# Patient Record
Sex: Female | Born: 1970 | Race: Black or African American | Hispanic: No | Marital: Single | Smoking: Never smoker
Health system: Southern US, Community
[De-identification: ages and names within clinical notes are randomized; demographics above are authoritative.]

## PROBLEM LIST (undated history)

## (undated) DIAGNOSIS — E785 Hyperlipidemia, unspecified: Secondary | ICD-10-CM

## (undated) DIAGNOSIS — R7303 Prediabetes: Secondary | ICD-10-CM

## (undated) DIAGNOSIS — I10 Essential (primary) hypertension: Secondary | ICD-10-CM

---

## 2012-10-07 ENCOUNTER — Ambulatory Visit: Payer: Self-pay | Admitting: Neurology

## 2012-10-07 ENCOUNTER — Inpatient Hospital Stay: Payer: Self-pay | Admitting: Internal Medicine

## 2012-10-07 LAB — URINALYSIS, COMPLETE
Bacteria: NONE SEEN
Bilirubin,UR: NEGATIVE
Glucose,UR: NEGATIVE mg/dL (ref 0–75)
Granular Cast: 1
Hyaline Cast: 12
Ketone: NEGATIVE
Ph: 5 (ref 4.5–8.0)
Protein: 30
RBC,UR: 17 /HPF (ref 0–5)
Specific Gravity: 1.026 (ref 1.003–1.030)

## 2012-10-07 LAB — COMPREHENSIVE METABOLIC PANEL
Anion Gap: 9 (ref 7–16)
BUN: 9 mg/dL (ref 7–18)
Chloride: 105 mmol/L (ref 98–107)
Co2: 24 mmol/L (ref 21–32)
Creatinine: 1.14 mg/dL (ref 0.60–1.30)
EGFR (African American): >60
Glucose: 119 mg/dL — ABNORMAL HIGH (ref 65–99)
Potassium: 3.7 mmol/L (ref 3.5–5.1)
SGOT(AST): 21 U/L (ref 15–37)
SGPT (ALT): 30 U/L (ref 12–78)
Sodium: 138 mmol/L (ref 136–145)

## 2012-10-07 LAB — CBC
HCT: 38.5 % (ref 35.0–47.0)
MCH: 28.6 pg (ref 26.0–34.0)
MCHC: 34.1 g/dL (ref 32.0–36.0)
MCV: 84 fL (ref 80–100)
RBC: 4.58 10*6/uL (ref 3.80–5.20)
RDW: 13.9 % (ref 11.5–14.5)
WBC: 14.5 10*3/uL — ABNORMAL HIGH (ref 3.6–11.0)

## 2012-10-07 LAB — DRUG SCREEN, URINE
Amphetamines, Ur Screen: NEGATIVE (ref ?–1000)
Benzodiazepine, Ur Scrn: NEGATIVE (ref ?–200)
Cannabinoid 50 Ng, Ur ~~LOC~~: NEGATIVE (ref ?–50)
Cocaine Metabolite,Ur ~~LOC~~: NEGATIVE (ref ?–300)
MDMA (Ecstasy)Ur Screen: NEGATIVE (ref ?–500)
Methadone, Ur Screen: NEGATIVE (ref ?–300)
Opiate, Ur Screen: NEGATIVE (ref ?–300)

## 2012-10-07 LAB — TROPONIN I
Troponin-I: <0.02 ng/mL
Troponin-I: <0.02 ng/mL
Troponin-I: <0.02 ng/mL

## 2012-10-07 LAB — CK TOTAL AND CKMB (NOT AT ARMC)
CK-MB: <0.5 ng/mL — ABNORMAL LOW (ref 0.5–3.6)
CK-MB: <0.5 ng/mL — ABNORMAL LOW (ref 0.5–3.6)

## 2012-10-07 LAB — SEDIMENTATION RATE: Erythrocyte Sed Rate: 48 mm/hr — ABNORMAL HIGH (ref 0–20)

## 2012-10-08 LAB — BASIC METABOLIC PANEL
Anion Gap: 6 — ABNORMAL LOW (ref 7–16)
BUN: 10 mg/dL (ref 7–18)
Calcium, Total: 8.4 mg/dL — ABNORMAL LOW (ref 8.5–10.1)
Chloride: 109 mmol/L — ABNORMAL HIGH (ref 98–107)
Creatinine: 1.06 mg/dL (ref 0.60–1.30)
EGFR (African American): >60
EGFR (Non-African Amer.): >60
Glucose: 106 mg/dL — ABNORMAL HIGH (ref 65–99)
Osmolality: 279 (ref 275–301)
Potassium: 3.9 mmol/L (ref 3.5–5.1)
Sodium: 140 mmol/L (ref 136–145)

## 2012-10-08 LAB — CK TOTAL AND CKMB (NOT AT ARMC): CK, Total: 118 U/L (ref 21–215)

## 2012-10-08 LAB — TSH: Thyroid Stimulating Horm: 3.2 u[IU]/mL

## 2012-10-08 LAB — LIPID PANEL
HDL Cholesterol: 36 mg/dL — ABNORMAL LOW (ref 40–60)
Ldl Cholesterol, Calc: 50 mg/dL (ref 0–100)
VLDL Cholesterol, Calc: 14 mg/dL (ref 5–40)

## 2012-10-08 LAB — CBC WITH DIFFERENTIAL/PLATELET
Basophil #: 0 10*3/uL (ref 0.0–0.1)
Basophil %: 0.5 %
Eosinophil #: 0.1 10*3/uL (ref 0.0–0.7)
Lymphocyte %: 30.7 %
MCH: 28.8 pg (ref 26.0–34.0)
MCHC: 34.4 g/dL (ref 32.0–36.0)
MCV: 84 fL (ref 80–100)
Monocyte #: 0.8 x10 3/mm (ref 0.2–0.9)
Neutrophil #: 6.5 10*3/uL (ref 1.4–6.5)
Platelet: 356 10*3/uL (ref 150–440)

## 2012-10-08 LAB — TROPONIN I: Troponin-I: <0.02 ng/mL

## 2012-10-11 ENCOUNTER — Encounter: Payer: Self-pay | Admitting: Family Medicine

## 2012-10-11 DIAGNOSIS — I639 Cerebral infarction, unspecified: Secondary | ICD-10-CM

## 2012-10-11 DIAGNOSIS — E039 Hypothyroidism, unspecified: Secondary | ICD-10-CM

## 2012-10-11 HISTORY — DX: Hypothyroidism, unspecified: E03.9

## 2012-10-11 HISTORY — DX: Cerebral infarction, unspecified: I63.9

## 2012-11-09 ENCOUNTER — Encounter: Payer: Self-pay | Admitting: Family Medicine

## 2013-11-08 IMAGING — CT CT HEAD WITHOUT CONTRAST
2 of 3 series · 17 of 30 positions shown, 20 images · non-contrast
Comparison: none

REASON FOR EXAM: weakness, slurred speech
COMMENTS:

PROCEDURE:     CT  - CT HEAD WITHOUT CONTRAST  - October 07, 2012  [DATE]
RESULT:     Comparison:  None
TECHNIQUE: Multiple axial images from the foramen magnum to the vertex were
obtained without IV contrast.

[Series 2: soft tissue · axial · 0.42mm/px · z∈[-618,-478]mm · 11 of 34 slices shown, 14 images]
[im 3/34  brain]
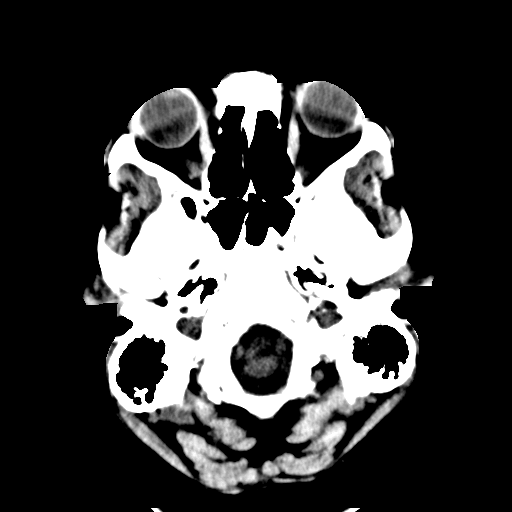
[im 3/34  bone]
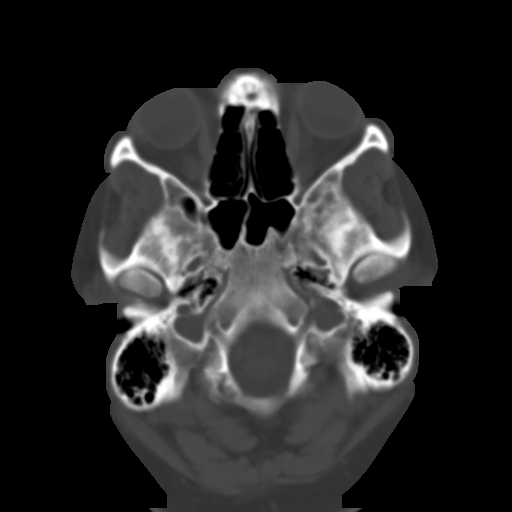
[im 6/34  brain]
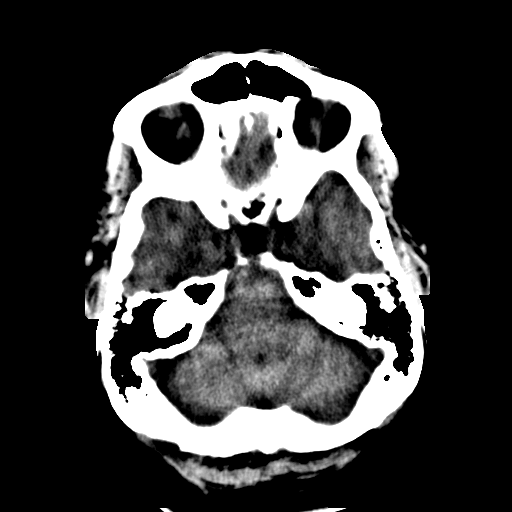
[im 9/34  brain]
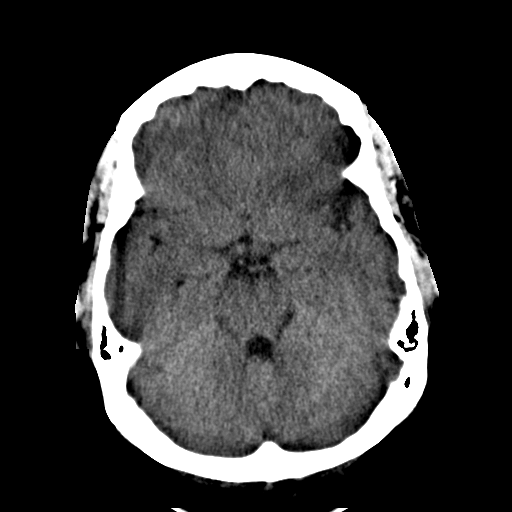
[im 12/34  brain]
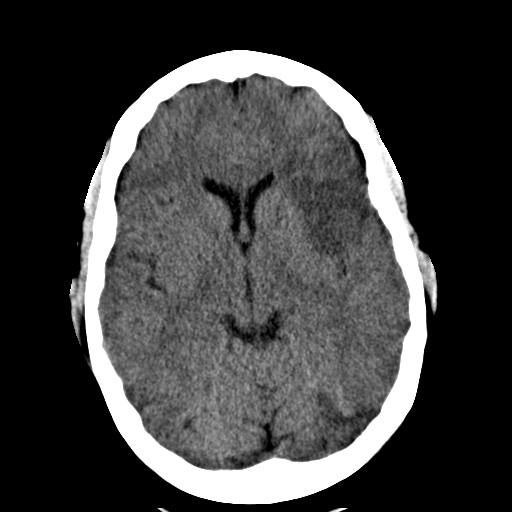
[im 14/34  brain]
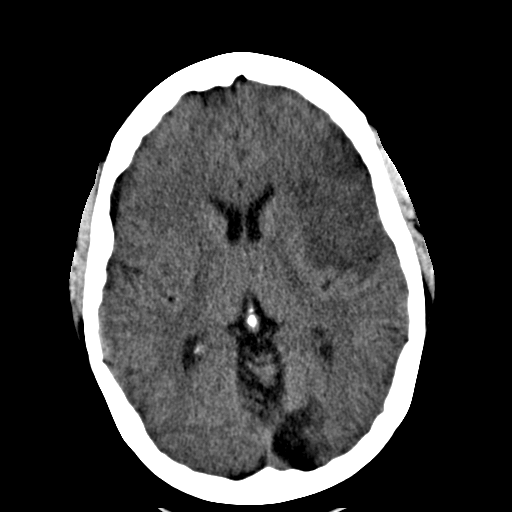
[im 14/34  bone]
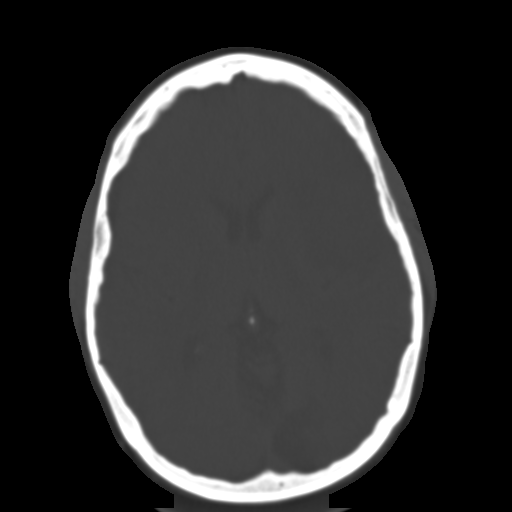
[im 17/34  brain]
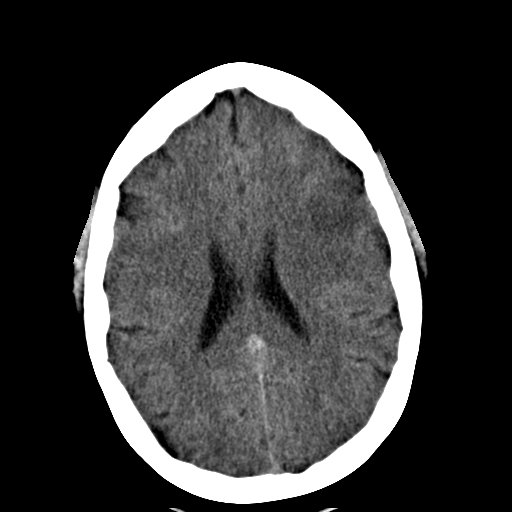
[im 20/34  brain]
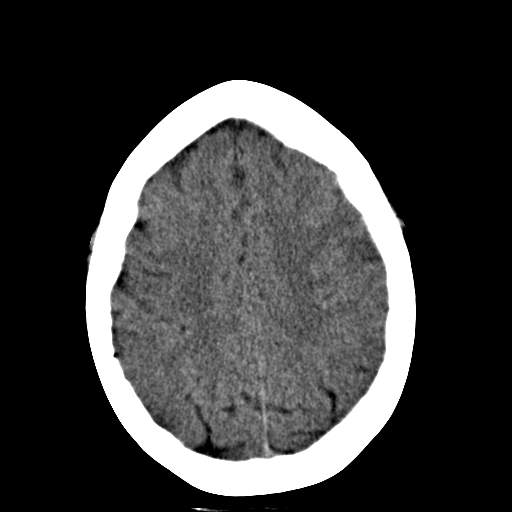
[im 23/34  brain]
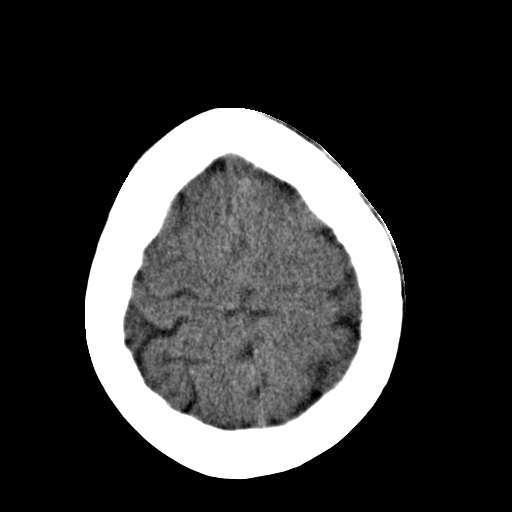
[im 25/34  brain]
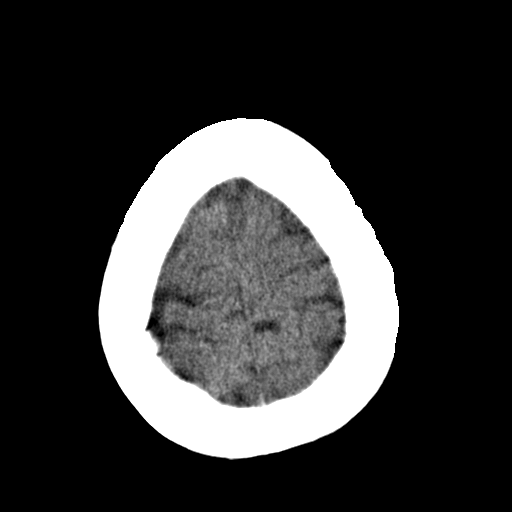
[im 25/34  bone]
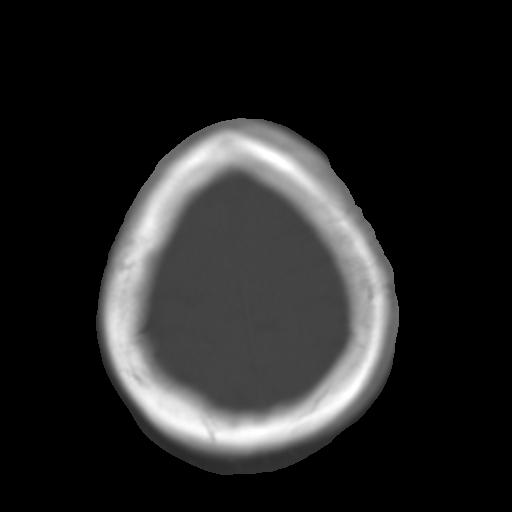
[im 28/34  brain]
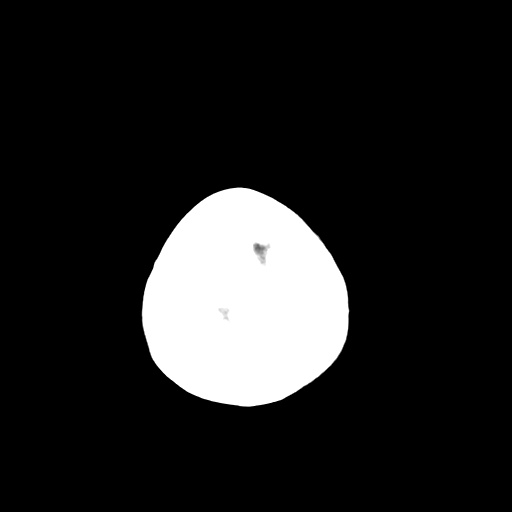
[im 31/34  brain]
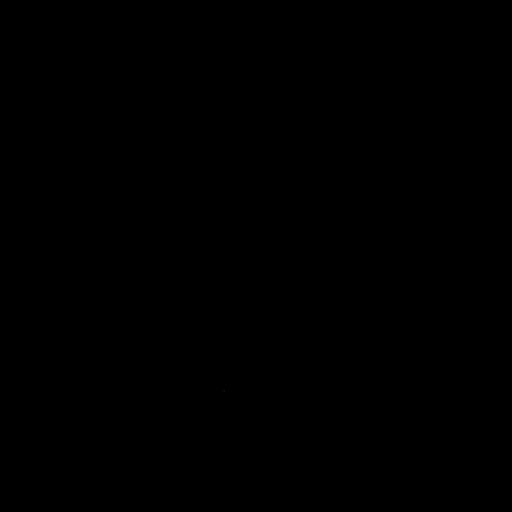

[Series 3: bone · axial · 0.42mm/px · z∈[-618,-528]mm · 6 of 33 slices shown]
[im 3/33  bone]
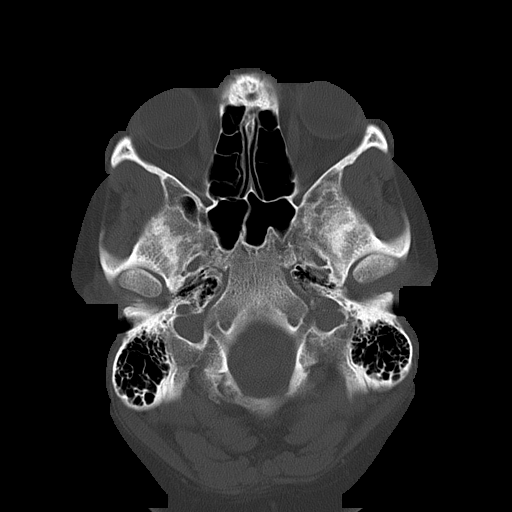
[im 6/33  bone]
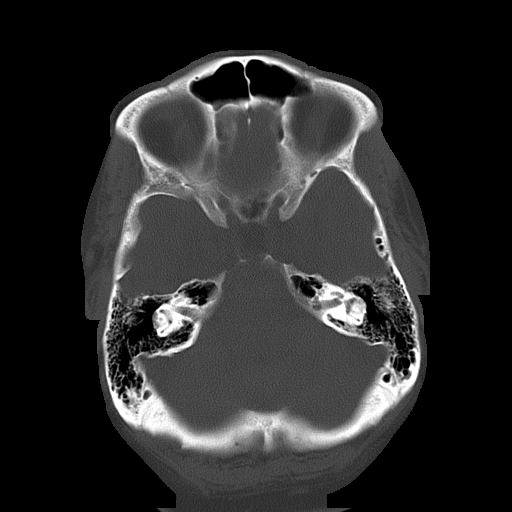
[im 12/33  bone]
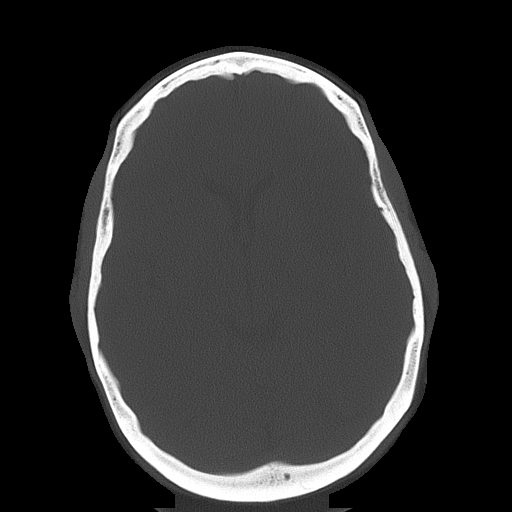
[im 15/33  bone]
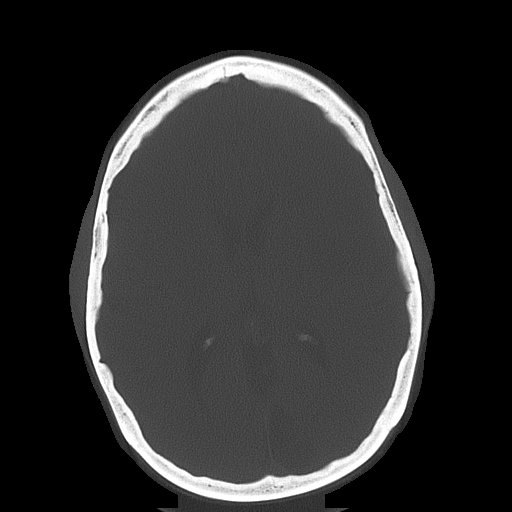
[im 18/33  bone]
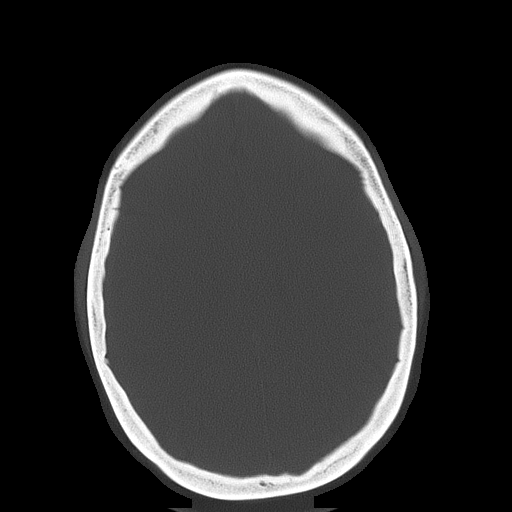
[im 21/33  bone]
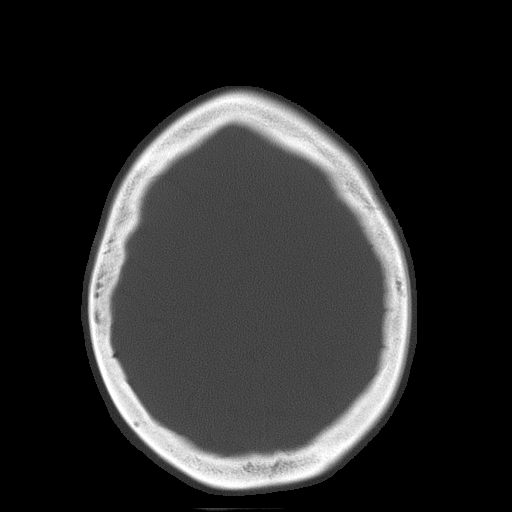

[17 of 30 positions shown; findings below may reference images not displayed]

FINDINGS: There is a focal area of loss of the gray-white junction and effacement of
sulci involving the left frontal lobe and insula. The findings are
concerning for acute infarct. No extra-axial fluid collection. No
intracranial hemorrhage. No midline shift.

The osseous structures are unremarkable.
IMPRESSION: Findings concerning for moderate sized acute infarct in the left frontal
lobe. Further evaluation could be provided with MRI.

## 2013-11-09 IMAGING — CT CT HEAD WITHOUT CONTRAST
1 series · 15 of 30 positions shown, 19 images · non-contrast
Comparison: none

REASON FOR EXAM: Hemarrhagic conversion of CVA. RUE weakness.
COMMENTS:

PROCEDURE:     CT  - CT HEAD WITHOUT CONTRAST  - October 08, 2012 [DATE]
RESULT:     Head CT dated the 8515 comparison made to prior study dated
10/08/2012.
TECHNIQUE: Helical noncontrasted 5 mm sections were obtained from the skull
base to the vertex.

[Series 2: soft tissue · axial · 0.43mm/px · z∈[-90,+45]mm · 15 of 30 slices shown, 19 images]
[im 2/30  brain]
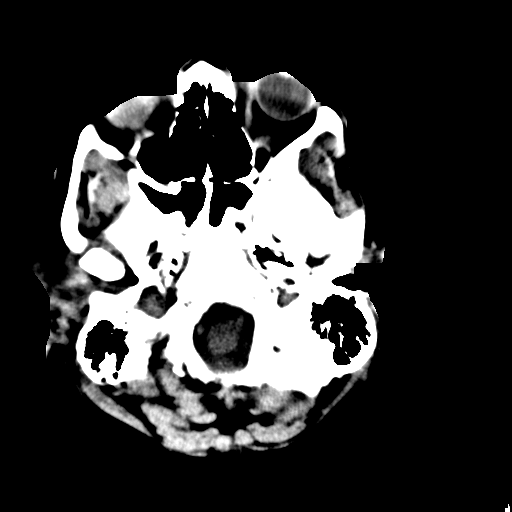
[im 2/30  bone]
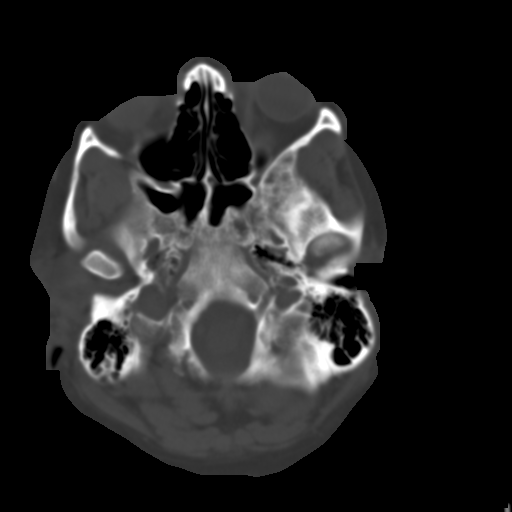
[im 4/30  brain]
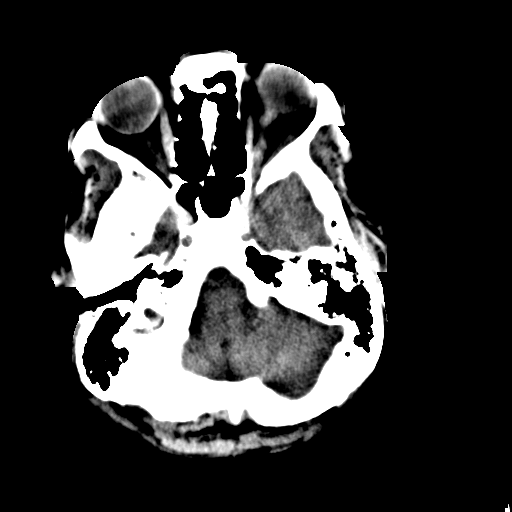
[im 6/30  brain]
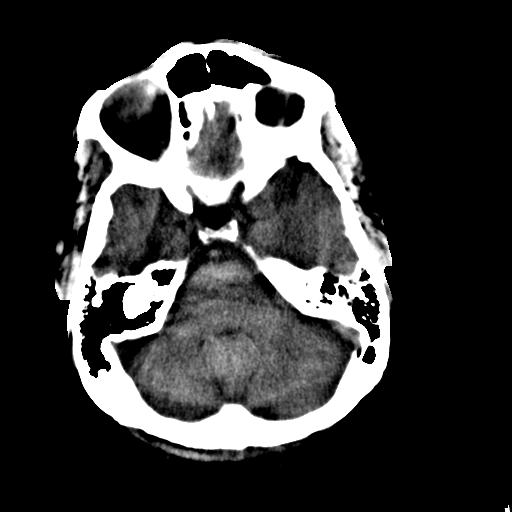
[im 8/30  brain]
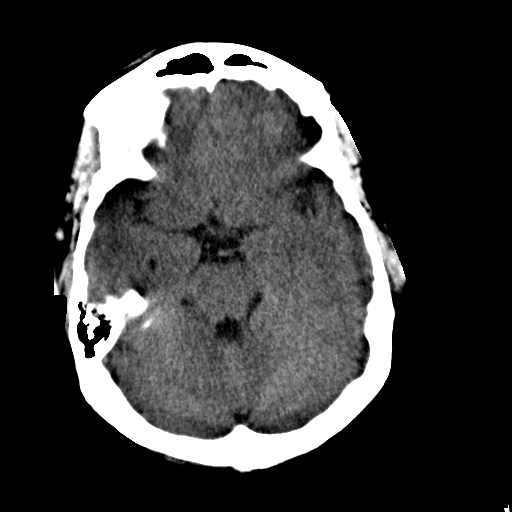
[im 10/30  brain]
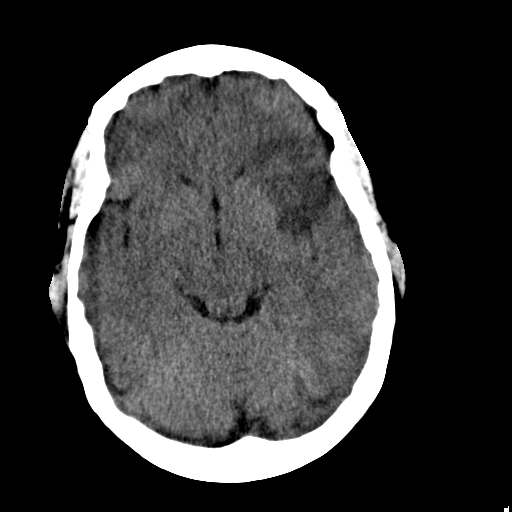
[im 10/30  bone]
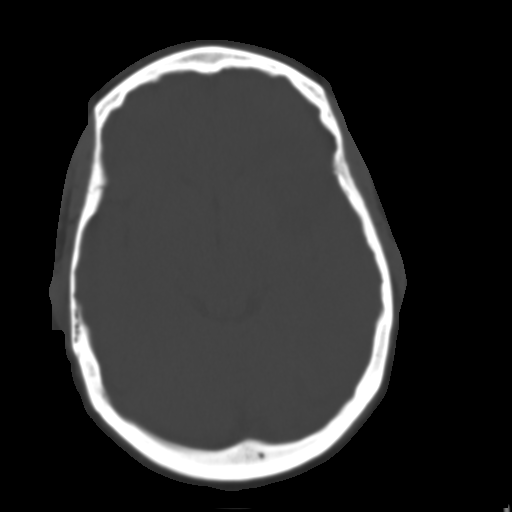
[im 12/30  brain]
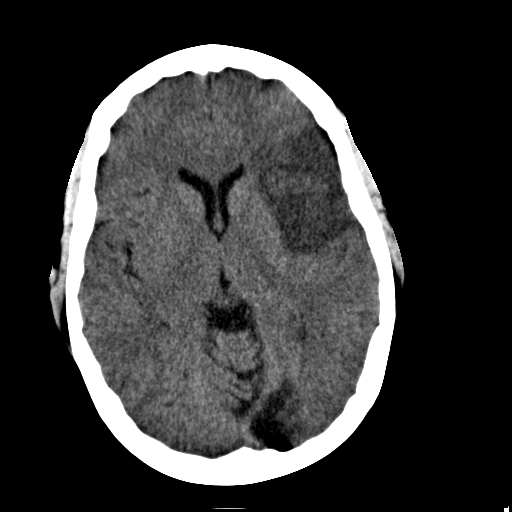
[im 14/30  brain]
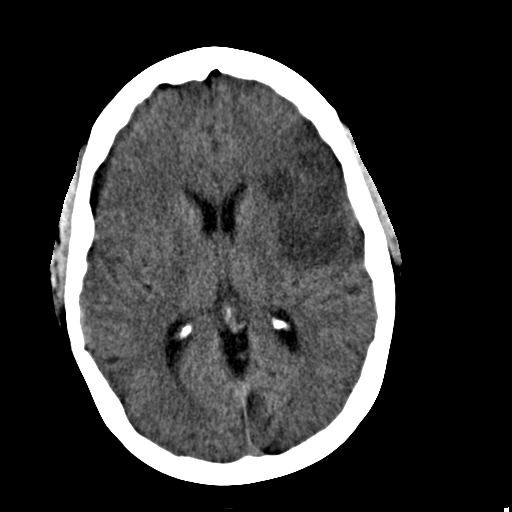
[im 16/30  brain]
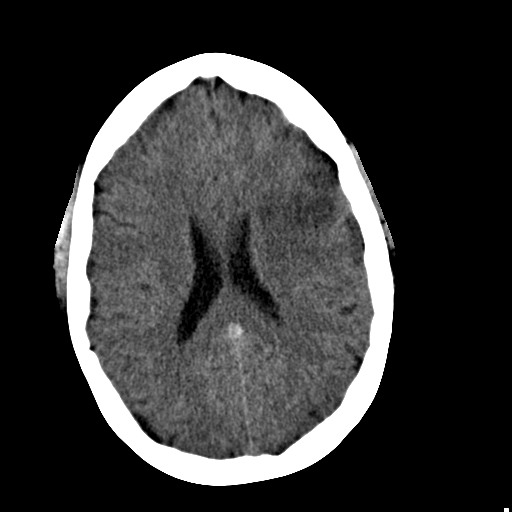
[im 17/30  brain]
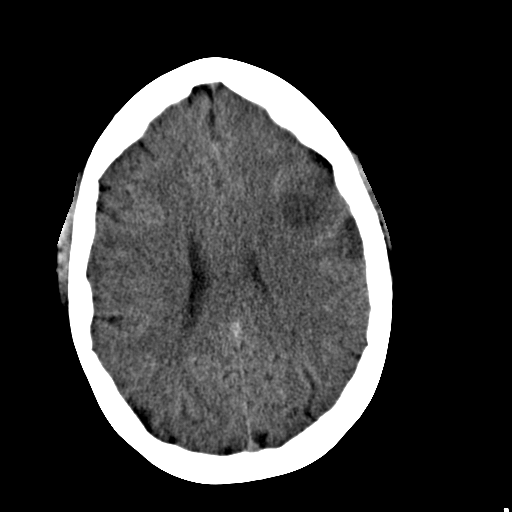
[im 17/30  bone]
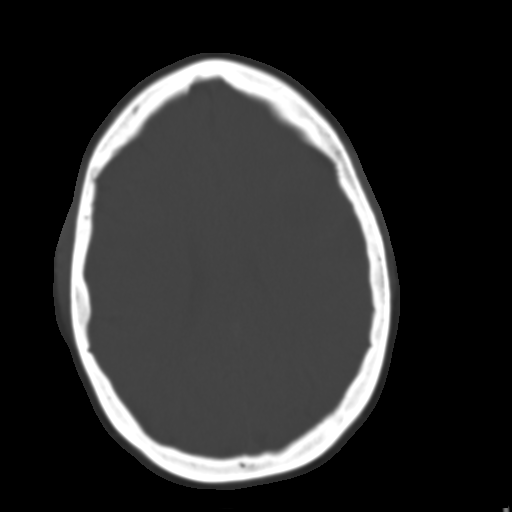
[im 19/30  brain]
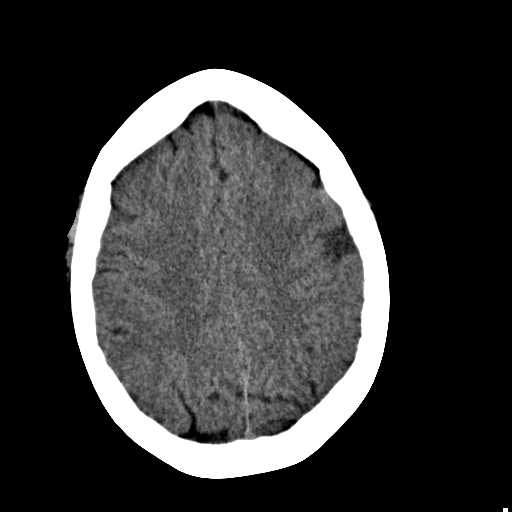
[im 21/30  brain]
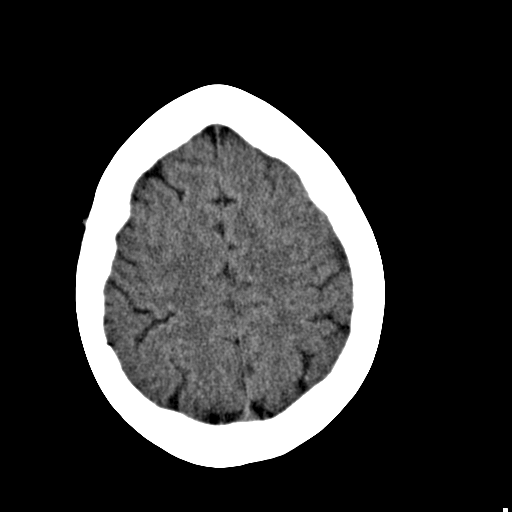
[im 23/30  brain]
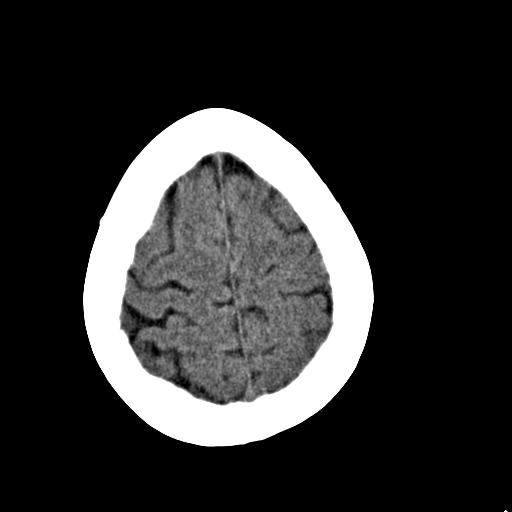
[im 25/30  brain]
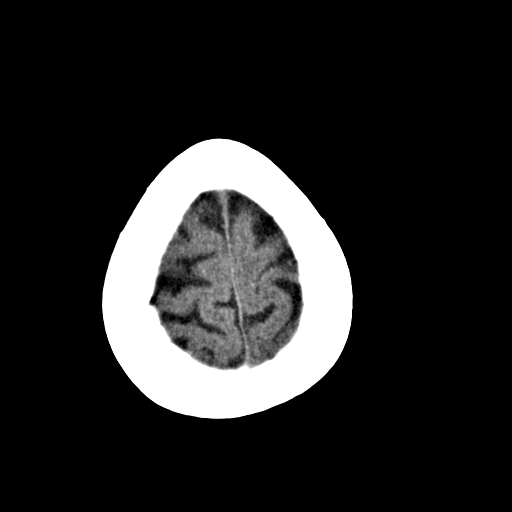
[im 25/30  bone]
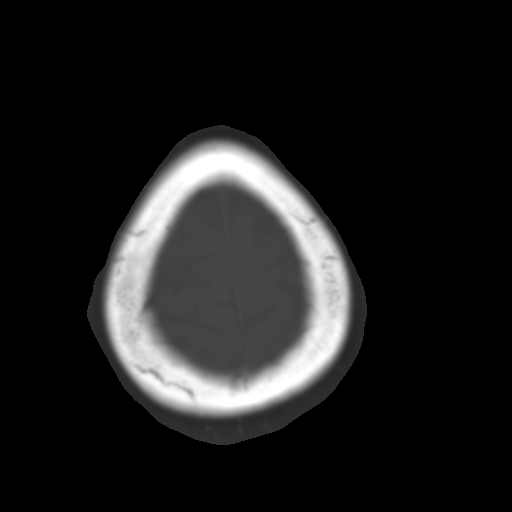
[im 27/30  brain]
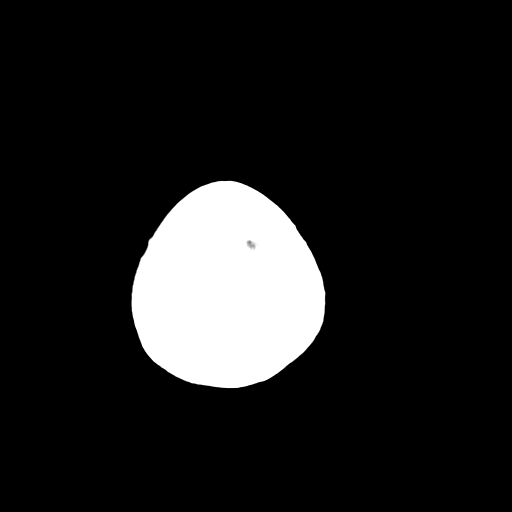
[im 29/30  brain]
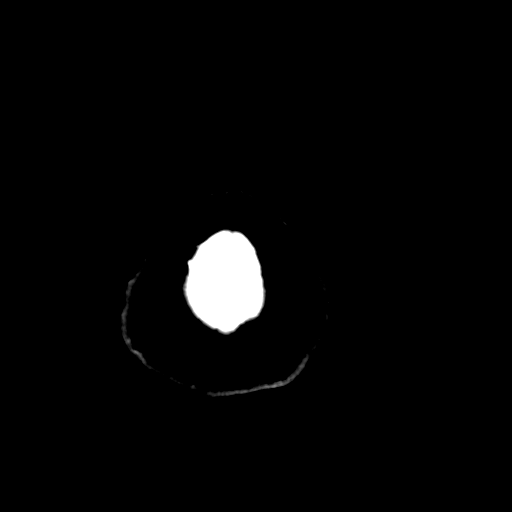

[15 of 30 positions shown; findings below may reference images not displayed]

FINDINGS: A territorial region of crescent to wedge-shaped low attenuation
projects within the left anterior parietal region. The borders of this
finding appear more well-defined when compared to the previous study there
has been slight increase in the low attenuating architecture. These findings
are consistent with an area of subacute MCA distribution infarction. There
is no CT evidence of an underlying hemorrhage. There is no evidence of mass
effect. An area of encephalomalacic change projects within the parafalcine
region of the left occipital lobe. No further evidence of intra-axial nor
extra-axial fluid collections identified nor evidence of further regions of
subacute infarction. The ventricles and cisterns are patent. There is no
evidence of a depressed skull fracture. The visualized paranasal sinuses and
mastoid air cells are patent.
IMPRESSION: Findings consistent with the previously described region of
left MCA distribution infarction.
2. Findings consistent with an area of chronic left occipital lobe
infarction.
3. There is no definitive evidence of acute hemorrhage.

## 2014-07-24 DIAGNOSIS — R011 Cardiac murmur, unspecified: Secondary | ICD-10-CM

## 2014-07-24 HISTORY — DX: Cardiac murmur, unspecified: R01.1

## 2014-09-01 NOTE — H&P (Signed)
PATIENT NAME:  Tonya Stephens, Tonya Stephens MR#:  [id number] DATE OF BIRTH:  12/22/1970  DATE OF ADMISSION:  10/07/2012  REFERRING PHYSICIAN: Dr. Lucrezia EuropeAllison Webster.   PRIMARY CARE PHYSICIAN: None.   CHIEF COMPLAINT: Slurred speech and right arm weakness.   HISTORY OF PRESENT ILLNESS: [name] is a 44 year old morbidly obese African American female with history of hypothyroidism, presented to the Emergency Department with complaints of slurred speech and a right arm weakness, started since this morning. The patient woke up and was getting ready to go to work. She started to feed poorly, with a headache and right arm pain. Considering this, the patient did not go to work. However, the patient received a couple of calls when the patient noticed herself having slurred speech. As the patient continued to have these symptoms, presented to the Emergency Department. Workup in the Emergency Department with CT head without contrast showed the patient has a right MCA stroke. The patient denies having any weakness in the right lower leg.    No history of heart problems. No history of diabetes mellitus and hypertension. A grandmother with a history of clotting disorders. Denies having any recent travel, recent upper respiratory tract infection.   PAST MEDICAL HISTORY:  1.  Hypothyroidism.  2.  Morbid obesity.   PAST SURGICAL HISTORY: None.   ALLERGIES: PENICILLIN.   HOME MEDICATIONS: Levothyroxine 25 mcg once a day.   SOCIAL HISTORY: No history of smoking, drinking alcohol or using illicit drugs. Works for [employer].   FAMILY HISTORY: Hypertension, diabetes mellitus, and clotting problems in the grandmother.   REVIEW OF SYSTEMS: Generalized weakness, fatigue. No blurred vision.  ENT: Slurred speech.  RESPIRATORY: No cough, shortness of breath.  CARDIOVASCULAR: No chest pain, palpitations.  GASTROINTESTINAL: No nausea, vomiting and diarrhea, abdominal pain.  GENITOURINARY: No dysuria or hematuria.    ENDOCRINE: No polyphagia or polydipsia.  HEMATOLOGIC: No easy bruising or bleeding.  SKIN: No rash or lesions.  MUSCULOSKELETAL: No joint pains or aches. NEUROLOGIC: The patient has a right MCA stroke.   PHYSICAL EXAMINATION: GENERAL: This is a well-built, well-nourished, age-appropriate female with morbid obesity, sitting in the bed not in distress.  VITAL SIGNS: Temperature 99.1, pulse 87, blood pressure 128/89, respiratory rate of  20, oxygen saturations 98% on room air.  HEENT: Normocephalic, atraumatic. There is no scleral icterus. Conjunctivae normal. Pupils equal and reactive to light. Extraocular movements are intact. Mucous membranes moist. No pharyngeal erythema.  NECK: Supple. No lymphadenopathy. No JVD. No carotid bruit. Could not appreciate any thyromegaly.  CHEST: Has no focal tenderness.  LUNGS: Bilaterally clear to auscultation.  HEART: S1, S2 regular. No murmurs are heard. No pedal edema. Pulses are 2+.  ABDOMEN: Bowel sounds present. Soft, nontender, nondistended. Obese abdomen. I could not appreciate any hepatosplenomegaly.  SKIN: No rash or lesions.  MUSCULOSKELETAL: Good range of motion in all the extremities.   LABS: CBC: WBC of 14.5, hemoglobin 13.1, platelet count of 247. CMP is completely within normal limits.   Troponin is negative. UA negative for nitrites, leukocyte esterase, WBC 9. Epithelial cells 4.  Urine drug screen is negative. EKG, 12-lead: Normal sinus rhythm with no ST-T wave abnormalities.   CT head without contrast showed a right MCA stroke.   ASSESSMENT AND PLAN: [name] is a 44 year old female who comes to the Emergency Department with a right MCA stroke causing her to have a right facial droop and right arm weakness.   1.  Cerebrovascular accident:  The patient is much  past the time for the t-PA administration. Continue with aspirin. Differential diagnoses are extensive in a 44 year old female with a stroke. The possibilities of  vasculitis, as the patient has a family history of clotting disorders. Considering the patient's morbid obesity, there is also possibility of thromboembolic disease. Will also check the RPR. The patient denies having any sexually-transmitted diseases in the past. Will obtain MRI of the brain in the morning. Obtain an echocardiogram, carotid Dopplers. will obtain echocardiogram with bubble study. Will also obtain lower extremity Dopplers, CRP, sed rate.  Will also involve physical therapy and speech therapy. Keep the patient n.p.o. until patient passes swallow evaluation.  2.  Morbid obesity: Will need further counseling. The patient is in a depressed mood currently.  3.  Hypothyroidism: Will check the TSH levels. 4.  Keep the patient on DVT prophylaxis with Lovenox.  Time spent: Fifty 50 minutes.    ____________________________ Susa Griffins, MD pv:dm D: 10/07/2012 04:35:31 ET T: 10/07/2012 07:33:41 ET JOB#: [id number]  cc: Susa Griffins, MD, <Dictator> Susa Griffins MD ELECTRONICALLY SIGNED 10/08/2012 7:34

## 2014-09-01 NOTE — Discharge Summary (Signed)
PATIENT NAME:  [name], Larinda MR#:  [id number] DATE OF BIRTH:  Jun 28, 1970  DATE OF ADMISSION:  10/07/2012 DATE OF DISCHARGE:  10/08/2012  DISCHARGE DIAGNOSES: 1.  Left middle cerebral artery stroke.  2.  Hypothyroidism.  3.  Morbid obesity.   CONSULTS: Dr. Loretha BrasilZeylikman of neurology.   IMAGING STUDIES: Include:  1.  CT scan of the head, which showed left MCA stroke.  2.  MRI of the brain, which showed left MCA stroke with possible hemorrhagic infarction.  3.  Follow-up CT scan of the head showed no hemorrhage.   ADMITTING HISTORY AND PHYSICAL AND HOSPITAL COURSE: Please see detailed H and P dictated previously. In brief, morbidly obese African American female patient of 41 years,  presented to the hospital complaining of acute onset of some slurred speech and right upper extremity weakness. The patient was found to have a large left MCA stroke, was admitted to the hospitalist service for further work-up and treatment. The patient's work-up was continued as advised by Dr. Loretha BrasilZeylikman with an MRI of the brain, MRA, echocardiogram. Did not show any occlusion or significant stenosis of the arteries which needed intervention. There was concern about some hemorrhage in the infarction area on the MRI, for which a follow-up CT was advised by neurology which was obtained. There was no hemorrhage found. The patient's aspirin and Lovenox were held during the hospital stay secondary to concern for hemorrhage, but which have been restarted. The patient has been counseled to exercise regularly. Continue with her aspirin and statin and lose weight. Follow up with Dr. Sherryll BurgerShah of neurology as outpatient.   On day of discharge, the patient does not have any weakness in her right upper extremity. Still seems to have mild slurred speech at times.   DISCHARGE MEDICATIONS: Include: 1.  Aspirin 81 mg oral once a day.  2.  Levothyroxine 25 mcg oral once a day.  3.  Atorvastatin 20 mg oral once a day.   DISCHARGE  INSTRUCTIONS: The patient was discharged home on a low-fat, low-cholesterol diet. Activity as tolerated. Follow up with Dr. Sherryll BurgerShah of neurology in 1 to 2 weeks.   TIME SPENT: On day of discharge in discharge activity was 38 minutes.    ____________________________ Molinda BailiffSrikar R. Myria Steenbergen, MD srs:jm D: 10/09/2012 15:23:00 ET T: 10/09/2012 20:41:50 ET JOB#: [id number]  cc: Wardell HeathSrikar R. Nadene Witherspoon, MD, <Dictator> Orie FishermanSRIKAR R Hassani Sliney MD ELECTRONICALLY SIGNED 11/02/2012 10:42

## 2014-09-01 NOTE — Consult Note (Signed)
PATIENT NAME:  Tonya Stephens, Tonya Stephens MR#:  [id number] DATE OF BIRTH:  1970/07/09  DATE OF CONSULTATION:  10/07/2012  REFERRING PHYSICIAN:   CONSULTING PHYSICIAN:  Leotis Pain, MD  REASON FOR CONSULTATION:  Stroke.   HISTORY OF PRESENT ILLNESS:  This is a pleasant 44 year old female with morbid obesity and other significant past medical history of hypothyroidism on Synthroid at home who presented to Emergency Department will complaint of dysarthria and right upper extremity weakness. The patient's symptoms started the last morning. The patient states that she woke up, getting ready to work at which time she had difficulty using her right upper extremity because of the feeding and then she tried to call one of her family members and while she was in the phone, it was noticed that her speech was not her normal self and she had dysarthric speech. Never had such symptoms in the past and currently she states her symptoms are much improved, dysarthria still evident and right upper extremity is close to her normal baseline. Workup in the Emergency Department showed a CAT scan, which consistent with suspicion involving left superior division MCA infarct, suspicion of an infarct in the left frontal lobe plus there is a small left occipital encephalomalacia. The patient denies any blurry vision, denies any weakness in the right lower extremity, denies any sensory changes. It is the first time since symptoms and currently as above, she states she is feeling better. No history of diabetes. No history of cardiac problems. No history of hypertension or hyperlipidemia, no recent travel history and no recent upper respiratory tract infections. There is a history of a clotting disorder in the patient's uncle with history of bilateral DVTs at the age of 43.   PAST MEDICAL HISTORY:  Hypothyroidism and morbid obesity.   ALLERGIES:  PENICILLIN.   HOME MEDICATIONS:  Include Synthroid 25 mcg once a day.   SOCIAL HISTORY:  No  history of smoking, drinking alcohol or using illicit drugs. Works at [employer].   FAMILY HISTORY:  A history of hypertension, diabetes and a history of deep vein thrombosis in her uncle at the age of 1.   REVIEW OF SYSTEMS: CONSTITUTIONAL:  Generalized fatigue. No blurred vision.  ENT:  No sinus abnormalities.  RESPIRATORY:  No shortness of breath.  CARDIOVASCULAR:  No chest pain.  GASTROINTESTINAL:  No nausea. No vomiting.  GENITOURINARY:  No dysuria. No hematuria.  ENDOCRINE:  No polyuria or polydipsia.  HEMATOLOGIC:  No easy bruising or bleeding. SKIN:  No rash.  MUSCULOSKELETAL:  No joint pains.  NEUROLOGICAL:  Weakness in the right upper extremity and dysarthric speech.   PHYSICAL EXAMINATION:  The patient is alert, awake, oriented to time, place, location and the reason she is in the hospital, able to tell me daytime and the President of the [place]. Speech appears to be dysarthric. No signs of aphasia noticed. Cranial nerve examination:  Extraocular movements appear to be intact. Pupils 4 mm to 2 mm, reactive bilaterally. Visual fields are intact. There is a right facial droop that is present that is minimal seen when the patient is smiling. Facial sensation intact. Shoulder shrug intact. Motor strength:  No signs of drip noticed on upper extremities. On motor strength in upper extremities, her delta 5/5 bilaterally. Hand grip are 5/5 bilaterally. Biceps 5/5 bilaterally, but the deltoid is 5-/5 in the right lower extremity compared to 5/5 in the left lower extremity. The patient is 5/5 bilateral lower extremities in terms of strength. Sensation:  Diminished sensation in her right lower extremity to light touch and temperature. Coordination:  Finger-to-nose intact. Reflexes diminished throughout. Gait:  The patient was witnessed to ambulate with minimal assistance to the bathroom.   IMPRESSION:  This is a 44 year old obese female with past medical history of  hypothyroidism on Synthroid, presenting with left superior division middle cerebral artery infarct with the residual dysarthria, right facial droop, right triceps weakness,  as well as minimal sensory deficits of the right lower extremity. The patient does have a history of a hypercoagulable state in her uncle who has a history of bilateral deep venous thromboses at age of 58.   PLAN:  Aspirin 325, statin which the patient is currently on, Lovenox for DVT prophylaxis. MRI of the brain. I have added on MRA of the brain with contrast to look for possibility of vasculitis since the ESR was elevated at 4810 and the patient has elevated white blood cell count. A 2-D echo, telemetry, hemoglobin A1c, lipid panel. If her MRA does not show any signs of suspicion of vasculitis, would perform hypercoagulable panel looking Leiden factor V, protein C, protein S deficiency which are the most common hypercoagulable disorders. The patient's results can be followed up as an outpatient since it takes 7 to 10 days to come back. Have Physical Therapy and Occupational Therapy see the patient. I suspect the patient can be discharged in the next day or so. The case was discussed with the patient and the patient's family who are at bedside. Thank you, it was a pleasure seeing this patient.   ____________________________ Leotis Pain, MD yz:jm D: 10/07/2012 11:14:49 ET T: 10/07/2012 11:56:28 ET JOB#: [id number]  cc: Leotis Pain, MD, <Dictator> Leotis Pain MD ELECTRONICALLY SIGNED 10/08/2012 14:41

## 2017-02-11 HISTORY — PX: INTRAUTERINE DEVICE (IUD) INSERTION: SHX5877

## 2021-09-06 ENCOUNTER — Encounter: Payer: Self-pay | Admitting: *Deleted

## 2021-09-09 ENCOUNTER — Other Ambulatory Visit: Payer: Self-pay

## 2021-09-09 ENCOUNTER — Ambulatory Visit
Admission: RE | Admit: 2021-09-09 | Discharge: 2021-09-09 | Disposition: A | Discharge status: Home / Self Care | Payer: BC Managed Care – PPO | Source: Ambulatory Visit | Attending: Gastroenterology | Admitting: Gastroenterology

## 2021-09-09 ENCOUNTER — Encounter: Payer: Self-pay | Admitting: *Deleted

## 2021-09-09 ENCOUNTER — Ambulatory Visit: Payer: BC Managed Care – PPO | Admitting: Certified Registered Nurse Anesthetist

## 2021-09-09 ENCOUNTER — Encounter
Admission: RE | Disposition: A | Discharge status: Home / Self Care | Payer: Self-pay | Source: Ambulatory Visit | Attending: Gastroenterology

## 2021-09-09 DIAGNOSIS — E785 Hyperlipidemia, unspecified: Secondary | ICD-10-CM | POA: Diagnosis not present

## 2021-09-09 DIAGNOSIS — Z1211 Encounter for screening for malignant neoplasm of colon: Secondary | ICD-10-CM | POA: Insufficient documentation

## 2021-09-09 DIAGNOSIS — I10 Essential (primary) hypertension: Secondary | ICD-10-CM | POA: Insufficient documentation

## 2021-09-09 DIAGNOSIS — Q438 Other specified congenital malformations of intestine: Secondary | ICD-10-CM | POA: Insufficient documentation

## 2021-09-09 DIAGNOSIS — Z6841 Body Mass Index (BMI) 40.0 and over, adult: Secondary | ICD-10-CM | POA: Diagnosis not present

## 2021-09-09 DIAGNOSIS — Z8673 Personal history of transient ischemic attack (TIA), and cerebral infarction without residual deficits: Secondary | ICD-10-CM | POA: Diagnosis not present

## 2021-09-09 HISTORY — DX: Hyperlipidemia, unspecified: E78.5

## 2021-09-09 HISTORY — DX: Morbid (severe) obesity due to excess calories: E66.01

## 2021-09-09 HISTORY — DX: Prediabetes: R73.03

## 2021-09-09 HISTORY — PX: COLONOSCOPY WITH PROPOFOL: SHX5780

## 2021-09-09 HISTORY — DX: Essential (primary) hypertension: I10

## 2021-09-09 LAB — POCT PREGNANCY, URINE: Preg Test, Ur: NEGATIVE

## 2021-09-09 SURGERY — COLONOSCOPY WITH PROPOFOL
Anesthesia: General

## 2021-09-09 MED ORDER — PROPOFOL 500 MG/50ML IV EMUL
INTRAVENOUS | Status: DC | PRN
  Administered 2021-09-09: 125 ug/kg/min via INTRAVENOUS

## 2021-09-09 MED ORDER — SODIUM CHLORIDE 0.9 % IV SOLN: INTRAVENOUS | Status: DC

## 2021-09-09 MED ORDER — LIDOCAINE HCL (CARDIAC) PF 100 MG/5ML IV SOSY
PREFILLED_SYRINGE | INTRAVENOUS | Status: DC | PRN
  Administered 2021-09-09: 50 mg via INTRAVENOUS

## 2021-09-09 MED ORDER — PROPOFOL 10 MG/ML IV BOLUS
INTRAVENOUS | Status: DC | PRN
Start: 2021-09-09 — End: 2021-09-09
  Administered 2021-09-09: 20 mg via INTRAVENOUS
  Administered 2021-09-09: 50 mg via INTRAVENOUS
  Administered 2021-09-09: 10 mg via INTRAVENOUS

## 2021-09-09 MED ORDER — GLYCOPYRROLATE 0.2 MG/ML IJ SOLN
INTRAMUSCULAR | Status: DC | PRN
  Administered 2021-09-09: .2 mg via INTRAVENOUS

## 2021-09-09 NOTE — Op Note (Signed)
[facility] ?Gastroenterology ?Patient Name: Tonya Stephens ?Procedure Date: 09/09/2021 12:57 PM ?MRN: [id number] ?Account #: [id number] ?Date of Birth: 11/03/1970 ?Admit Type: Outpatient ?Age: 51 ?Room: [facility] ENDO ROOM 3 ?Gender: Female ?Note Status: Finalized ?Instrument Name: Colonoscope [id number] ?Procedure:             Colonoscopy ?Indications:           Screening for colorectal malignant neoplasm ?Providers:             Andrey Farmer MD, MD ?Referring MD:          Rubbie Battiest. Iona Beard MD, MD (Referring MD) ?Medicines:             Monitored Anesthesia Care ?Complications:         No immediate complications. ?Procedure:             Pre-Anesthesia Assessment: ?                       - Prior to the procedure, a History and Physical was  ?                       performed, and patient medications and allergies were  ?                       reviewed. The patient is competent. The risks and  ?                       benefits of the procedure and the sedation options and  ?                       risks were discussed with the patient. All questions  ?                       were answered and informed consent was obtained.  ?                       Patient identification and proposed procedure were  ?                       verified by the physician, the nurse, the  ?                       anesthesiologist, the anesthetist and the technician  ?                       in the endoscopy suite. Mental Status Examination:  ?                       alert and oriented. Airway Examination: normal  ?                       oropharyngeal airway and neck mobility. Respiratory  ?                       Examination: clear to auscultation. CV Examination:  ?                       normal. Prophylactic Antibiotics: The patient does not  ?  require prophylactic antibiotics. Prior  ?                       Anticoagulants: The patient has taken no previous  ?                       anticoagulant or antiplatelet  agents. ASA Grade  ?                       Assessment: III - A patient with severe systemic  ?                       disease. After reviewing the risks and benefits, the  ?                       patient was deemed in satisfactory condition to  ?                       undergo the procedure. The anesthesia plan was to use  ?                       monitored anesthesia care (MAC). Immediately prior to  ?                       administration of medications, the patient was  ?                       re-assessed for adequacy to receive sedatives. The  ?                       heart rate, respiratory rate, oxygen saturations,  ?                       blood pressure, adequacy of pulmonary ventilation, and  ?                       response to care were monitored throughout the  ?                       procedure. The physical status of the patient was  ?                       re-assessed after the procedure. ?                       After obtaining informed consent, the colonoscope was  ?                       passed under direct vision. Throughout the procedure,  ?                       the patient's blood pressure, pulse, and oxygen  ?                       saturations were monitored continuously. The  ?                       Colonoscope was introduced through the anus and  ?  advanced to the the cecum, identified by appendiceal  ?                       orifice and ileocecal valve. The colonoscopy was  ?                       somewhat difficult due to significant looping.  ?                       Successful completion of the procedure was aided by  ?                       applying abdominal pressure. The patient tolerated the  ?                       procedure well. The quality of the bowel preparation  ?                       was good. ?Findings: ?     The perianal and digital rectal examinations were normal. ?     The entire examined colon appeared normal on direct and retroflexion  ?      views. ?Impression:            - The entire examined colon is normal on direct and  ?                       retroflexion views. ?                       - No specimens collected. ?Recommendation:        - Discharge patient to home. ?                       - Resume previous diet. ?                       - Continue present medications. ?                       - Repeat colonoscopy in 10 years for screening  ?                       purposes. ?                       - Return to referring physician as previously  ?                       scheduled. ?Procedure Code(s):     --- Professional --- ?                       E9528, Colorectal cancer screening; colonoscopy on  ?                       individual not meeting criteria for high risk ?Diagnosis Code(s):     --- Professional --- ?                       Z12.11, Encounter for screening for malignant neoplasm  ?  of colon ?CPT copyright 2019 [employer]. All rights reserved. ?The codes documented in this report are preliminary and upon coder review may  ?be revised to meet current compliance requirements. ?Andrey Farmer MD, MD ?09/09/2021 1:22:25 PM ?Number of Addenda: 0 ?Note Initiated On: 09/09/2021 12:57 PM ?Scope Withdrawal Time: 0 hours 6 minutes 23 seconds  ?Total Procedure Duration: 0 hours 14 minutes 3 seconds  ?Estimated Blood Loss:  Estimated blood loss: none. ?     [facility] ?

## 2021-09-09 NOTE — Anesthesia Postprocedure Evaluation (Signed)
Anesthesia Post Note ? ?Patient: Tonya Stephens ? ?Procedure(s) Performed: COLONOSCOPY WITH PROPOFOL ? ?Patient location during evaluation: Endoscopy ?Anesthesia Type: General ?Level of consciousness: awake and alert ?Pain management: pain level controlled ?Vital Signs Assessment: post-procedure vital signs reviewed and stable ?Respiratory status: spontaneous breathing, nonlabored ventilation, respiratory function stable and patient connected to nasal cannula oxygen ?Cardiovascular status: blood pressure returned to baseline and stable ?Postop Assessment: no apparent nausea or vomiting ?Anesthetic complications: no ? ? ?No notable events documented. ? ? ?Last Vitals:  ?Vitals:  ? 09/09/21 1324 09/09/21 1334  ?BP: 101/68 (!) 129/91  ?Pulse: (!) 108 100  ?Resp: 18 (!) 24  ?Temp: (!) 36.3 ?C   ?SpO2: 100% 97%  ?  ?Last Pain:  ?Vitals:  ? 09/09/21 1334  ?TempSrc:   ?PainSc: 0-No pain  ? ? ?  ?  ?  ?  ?  ?  ? ?Corinda Gubler ? ? ? ? ?

## 2021-09-09 NOTE — Interval H&P Note (Signed)
History and Physical Interval Note: ? ?09/09/2021 ?12:55 PM ? ?Tonya Stephens  has presented today for surgery, with the diagnosis of Screening.  The various methods of treatment have been discussed with the patient and family. After consideration of risks, benefits and other options for treatment, the patient has consented to  Procedure(s): ?COLONOSCOPY WITH PROPOFOL (N/A) as a surgical intervention.  The patient's history has been reviewed, patient examined, no change in status, stable for surgery.  I have reviewed the patient's chart and labs.  Questions were answered to the patient's satisfaction.   ? ? ?Tonya Stephens ? ?Ok to proceed with colonoscopy ?

## 2021-09-09 NOTE — Transfer of Care (Signed)
Immediate Anesthesia Transfer of Care Note ? ?Patient: Tonya Stephens ? ?Procedure(s) Performed: COLONOSCOPY WITH PROPOFOL ? ?Patient Location: Endoscopy Unit ? ?Anesthesia Type:General ? ?Level of Consciousness: awake, alert  and oriented ? ?Airway & Oxygen Therapy: Patient Spontanous Breathing ? ?Post-op Assessment: Report given to RN and Post -op Vital signs reviewed and stable ? ?Post vital signs: Reviewed and stable ? ?Last Vitals:  ?Vitals Value Taken Time  ?BP    ?Temp    ?Pulse    ?Resp    ?SpO2    ? ? ?Last Pain:  ?Vitals:  ? 09/09/21 1324  ?TempSrc: Temporal  ?PainSc: 0-No pain  ?   ? ?  ? ?Complications: No notable events documented. ?

## 2021-09-09 NOTE — H&P (Signed)
Outpatient short stay form Pre-procedure ?09/09/2021  ?Lesly Rubenstein, MD ? ?Primary Physician: Sharyne Peach, MD ? ?Reason for visit:  Screening colonoscopy ? ?History of present illness:   ? ?51 y/o lady with history of obesity and HLD here for index screening colonoscopy. No blood thinners. No family history of GI malignancies. No significant abdominal surgeries. ? ? ? ?Current Facility-Administered Medications:  ?  0.9 %  sodium chloride infusion, , Intravenous, Continuous, Luise Yamamoto, Hilton Cork, MD, Last Rate: 20 mL/hr at 09/09/21 1239, New Bag at 09/09/21 1239 ? ?Medications Prior to Admission  ?Medication Sig Dispense Refill Last Dose  ? aspirin EC 81 MG tablet Take 81 mg by mouth daily. Swallow whole.   09/06/2021  ? CETIRIZINE HCL PO Take by mouth daily.   Past Week  ? levonorgestrel (MIRENA, 52 MG,) 20 MCG/DAY IUD 1 each by Intrauterine route once.     ? metoprolol succinate (TOPROL-XL) 50 MG 24 hr tablet Take 50 mg by mouth daily. Take with or immediately following a meal.   09/09/2021 at 0530  ? Multiple Vitamin (MULTIVITAMIN) tablet Take 1 tablet by mouth daily.   Past Week  ? atorvastatin (LIPITOR) 10 MG tablet Take 10 mg by mouth daily. (Patient not taking: Reported on 09/09/2021)   Not Taking  ? Na Sulfate-K Sulfate-Mg Sulf (SUPREP BOWEL PREP KIT PO) Take by mouth. (Patient not taking: Reported on 09/09/2021)   Completed Course  ? ? ? ?Allergies  ?Allergen Reactions  ? Penicillins Other (See Comments)  ? ? ? ?Past Medical History:  ?Diagnosis Date  ? Cardiac murmur 07/24/2014  ? Hyperlipidemia   ? Hypertension   ? Hypothyroid 10/11/2012  ? Morbid obesity ([place])   ? Pre-diabetes   ? Stroke [facility]) 10/11/2012  ? ? ?Review of systems:  Otherwise negative.  ? ? ?Physical Exam ? ?Gen: Alert, oriented. Appears stated age.  ?HEENT: PERRLA. ?Lungs: No respiratory distress ?CV: RRR ?Abd: soft, benign, no masses ?Ext: No edema ? ? ? ?Planned procedures: Proceed with colonoscopy. The patient understands the nature of  the planned procedure, indications, risks, alternatives and potential complications including but not limited to bleeding, infection, perforation, damage to internal organs and possible oversedation/side effects from anesthesia. The patient agrees and gives consent to proceed.  ?Please refer to procedure notes for findings, recommendations and patient disposition/instructions.  ? ? ? ?Lesly Rubenstein, MD ?[facility] ? ? ? ?  ? ?

## 2021-09-09 NOTE — Anesthesia Procedure Notes (Signed)
Procedure Name: [place] ?Date/Time: 09/09/2021 12:59 PM ?Performed by: Tollie Eth, CRNA ?Pre-anesthesia Checklist: Patient identified, Emergency Drugs available, Suction available and Patient being monitored ?Patient Re-evaluated:Patient Re-evaluated prior to induction ?Oxygen Delivery Method: Supernova nasal CPAP ?Induction Type: IV induction ?Placement Confirmation: positive ETCO2 ? ? ? ? ?

## 2021-09-09 NOTE — Anesthesia Preprocedure Evaluation (Signed)
Anesthesia Evaluation  ?Patient identified by MRN, date of birth, ID band ?Patient awake ? ? ? ?Reviewed: ?Allergy & Precautions, NPO status , Patient's Chart, lab work & pertinent test results ? ?History of Anesthesia Complications ?Negative for: history of anesthetic complications ? ?Airway ?Mallampati: II ? ?TM Distance: >3 FB ?Neck ROM: Full ? ? ? Dental ?no notable dental hx. ?(+) Teeth Intact ?  ?Pulmonary ?neg pulmonary ROS, neg sleep apnea, neg COPD, Patient abstained from smoking.Not current smoker,  ?  ? ?+ decreased breath sounds ? ? ? ? ? Cardiovascular ?Exercise Tolerance: Good ?METShypertension, Pt. on medications ?(-) CAD and (-) Past MI (-) dysrhythmias  ?Rhythm:Regular Rate:Normal ?- Systolic murmurs ? ?  ?Neuro/Psych ?CVA, No Residual Symptoms negative psych ROS  ? GI/Hepatic ?neg GERD  ,(+)  ?  ? (-) substance abuse ? ,   ?Endo/Other  ?neg diabetesHypothyroidism Morbid obesity ? Renal/GU ?negative Renal ROS  ? ?  ?Musculoskeletal ? ? Abdominal ?(+) + obese,   ?Peds ? Hematology ?  ?Anesthesia Other Findings ?Past Medical History: ?07/24/2014: Cardiac murmur ?No date: Hyperlipidemia ?No date: Hypertension ?10/11/2012: Hypothyroid ?No date: Morbid obesity ([facility]) ?No date: Pre-diabetes ?10/11/2012: Stroke ([facility]) ? Reproductive/Obstetrics ? ?  ? ? ? ? ? ? ? ? ? ? ? ? ? ?  ?  ? ? ? ? ? ? ? ? ?Anesthesia Physical ?Anesthesia Plan ? ?ASA: 3 ? ?Anesthesia Plan: General  ? ?Post-op Pain Management: Minimal or no pain anticipated  ? ?Induction: Intravenous ? ?PONV Risk Score and Plan: 3 and Propofol infusion, TIVA and Ondansetron ? ?Airway Management Planned: Natural Airway and Nasal CPAP ? ?Additional Equipment: None ? ?Intra-op Plan:  ? ?Post-operative Plan:  ? ?Informed Consent: I have reviewed the patients History and Physical, chart, labs and discussed the procedure including the risks, benefits and alternatives for the proposed anesthesia with the patient or authorized  representative who has indicated his/her understanding and acceptance.  ? ? ? ?Dental advisory given ? ?Plan Discussed with: CRNA and Surgeon ? ?Anesthesia Plan Comments: (Patient had natural airway anesthesia for hysteroscopy 5 years ago without documented issues. ?Discussed risks of anesthesia with patient, including possibility of difficulty with spontaneous ventilation under anesthesia necessitating airway intervention, PONV, and rare risks such as cardiac or respiratory or neurological events, and allergic reactions. Discussed the role of CRNA in patient's perioperative care. Patient understands. ?Patient informed about increased incidence of above perioperative risk due to high BMI. Patient understands. ?)  ? ? ? ? ? ? ?Anesthesia Quick Evaluation ? ?

## 2021-09-10 ENCOUNTER — Encounter: Payer: Self-pay | Admitting: Gastroenterology
